# Patient Record
Sex: Male | Born: 1980 | Race: White | Hispanic: No | Smoking: Current every day smoker
Health system: Southern US, Community
[De-identification: ages and names within clinical notes are randomized; demographics above are authoritative.]

## PROBLEM LIST (undated history)

## (undated) DIAGNOSIS — S43006A Unspecified dislocation of unspecified shoulder joint, initial encounter: Secondary | ICD-10-CM

## (undated) DIAGNOSIS — R011 Cardiac murmur, unspecified: Secondary | ICD-10-CM

## (undated) HISTORY — PX: KNEE SURGERY: SHX244

---

## 2002-02-20 ENCOUNTER — Emergency Department (HOSPITAL_COMMUNITY): Admission: EM | Admit: 2002-02-20 | Discharge: 2002-02-20 | Payer: Self-pay | Admitting: Internal Medicine

## 2005-12-18 ENCOUNTER — Emergency Department (HOSPITAL_COMMUNITY): Admission: EM | Admit: 2005-12-18 | Discharge: 2005-12-18 | Payer: Self-pay | Admitting: Emergency Medicine

## 2009-07-15 ENCOUNTER — Emergency Department (HOSPITAL_COMMUNITY): Admission: EM | Admit: 2009-07-15 | Discharge: 2009-07-15 | Payer: Self-pay | Admitting: Emergency Medicine

## 2010-08-07 ENCOUNTER — Encounter: Payer: Self-pay | Admitting: *Deleted

## 2010-08-07 ENCOUNTER — Emergency Department (HOSPITAL_COMMUNITY)
Admission: EM | Admit: 2010-08-07 | Discharge: 2010-08-07 | Disposition: A | Discharge status: Home / Self Care | Payer: Self-pay | Attending: Emergency Medicine | Admitting: Emergency Medicine

## 2010-08-07 DIAGNOSIS — F17200 Nicotine dependence, unspecified, uncomplicated: Secondary | ICD-10-CM | POA: Insufficient documentation

## 2010-08-07 DIAGNOSIS — Z0000 Encounter for general adult medical examination without abnormal findings: Secondary | ICD-10-CM | POA: Insufficient documentation

## 2010-08-07 DIAGNOSIS — Z0189 Encounter for other specified special examinations: Secondary | ICD-10-CM | POA: Insufficient documentation

## 2010-08-07 LAB — CBC
HCT: 43.1 % (ref 39.0–52.0)
Hemoglobin: 14.4 g/dL (ref 13.0–17.0)
MCV: 86.2 fL (ref 78.0–100.0)
RDW: 12.6 % (ref 11.5–15.5)
WBC: 5.7 10*3/uL (ref 4.0–10.5)

## 2010-08-07 LAB — RAPID URINE DRUG SCREEN, HOSP PERFORMED
Amphetamines: NOT DETECTED
Barbiturates: NOT DETECTED
Benzodiazepines: POSITIVE — AB
Cocaine: NOT DETECTED
Opiates: NOT DETECTED
Tetrahydrocannabinol: POSITIVE — AB

## 2010-08-07 LAB — BASIC METABOLIC PANEL
BUN: 12 mg/dL (ref 6–23)
Chloride: 103 mEq/L (ref 96–112)
Creatinine, Ser: 0.82 mg/dL (ref 0.50–1.35)
GFR calc Af Amer: >60 mL/min (ref >60)
Glucose, Bld: 107 mg/dL — ABNORMAL HIGH (ref 70–99)
Potassium: 4.1 mEq/L (ref 3.5–5.1)

## 2010-08-07 LAB — URINALYSIS, ROUTINE W REFLEX MICROSCOPIC
Bilirubin Urine: NEGATIVE
Glucose, UA: NEGATIVE mg/dL
Specific Gravity, Urine: >1.03 — ABNORMAL HIGH (ref 1.005–1.030)
Urobilinogen, UA: 0.2 mg/dL (ref 0.0–1.0)
pH: 5.5 (ref 5.0–8.0)

## 2010-08-07 NOTE — ED Notes (Signed)
Pt eating. Nad.

## 2010-08-07 NOTE — ED Notes (Signed)
Ella from [facility] has been called and will be here asap. Nad. Urine obtained.

## 2010-08-07 NOTE — ED Notes (Signed)
edp in with pt now 
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
## 2010-08-07 NOTE — ED Notes (Signed)
Pt brought in by police for IVC. Pt denies suicidal or homicidal ideations.

## 2010-08-07 NOTE — ED Notes (Signed)
Ella from [facility] in to see pt at this time. Nad. Pt cooperative.

## 2010-09-17 ENCOUNTER — Emergency Department (HOSPITAL_COMMUNITY)
Admission: EM | Admit: 2010-09-17 | Discharge: 2010-09-17 | Discharge status: Against Medical Advice | Payer: Self-pay | Attending: Emergency Medicine | Admitting: Emergency Medicine

## 2010-09-17 ENCOUNTER — Encounter (HOSPITAL_COMMUNITY): Payer: Self-pay | Admitting: *Deleted

## 2010-09-17 DIAGNOSIS — J029 Acute pharyngitis, unspecified: Secondary | ICD-10-CM | POA: Insufficient documentation

## 2010-09-17 DIAGNOSIS — F17200 Nicotine dependence, unspecified, uncomplicated: Secondary | ICD-10-CM | POA: Insufficient documentation

## 2010-09-17 DIAGNOSIS — B349 Viral infection, unspecified: Secondary | ICD-10-CM

## 2010-09-17 HISTORY — DX: Unspecified dislocation of unspecified shoulder joint, initial encounter: S43.006A

## 2010-09-17 MED ORDER — ACETAMINOPHEN 160 MG/5ML PO SOLN
650.0000 mg | Freq: Once | ORAL | Status: AC
  Administered 2010-09-17: 650 mg via ORAL
  Filled 2010-09-17: qty 20.3

## 2010-09-17 NOTE — ED Notes (Signed)
Sore throat, fever, n/v unable to eat due to pain with swallowing, fever at triage >102, pt did not take tylenol or ibu today at home,

## 2010-09-17 NOTE — ED Provider Notes (Signed)
History     CSN: [id number] Arrival date & time: 09/17/2010 11:51 AM   Chief Complaint  Patient presents with  . Sore Throat  . Fever     (Include location/radiation/quality/duration/timing/severity/associated sxs/prior treatment) HPI  Complains of sore throat, pain with swallowing chills and myalgias for 3 days treated with ibuprofen yesterday with partial relief of symptoms . No vomiting no abdominal pain no shortness of breath admits to mild cough pain worse with swallowing improved with ibuprofen Past Medical History  Diagnosis Date  . Dislocated shoulder     several years ago      Past Surgical History  Procedure Date  . Knee surgery     left     Family History  Problem Relation Age of Onset  . Diabetes Mother   . Diabetes Sister     History  Substance Use Topics  . Smoking status: Current Everyday Smoker -- 0.5 packs/day    Types: Cigarettes  . Smokeless tobacco: Not on file  . Alcohol Use: Yes     occasional beer   Denies drug use   Review of Systems  Constitutional: Positive for chills.  HENT: Negative.        Sore throat, pain with slow  Eyes: Negative.   Respiratory: Positive for cough. Negative for shortness of breath.   Cardiovascular: Negative.   Gastrointestinal: Negative.   Musculoskeletal: Positive for myalgias.  Skin: Negative.   Neurological: Negative.   Hematological: Negative.   Psychiatric/Behavioral: Negative.     Allergies  Review of patient's allergies indicates no known allergies.  Home Medications   Current Outpatient Rx  Name Route Sig Dispense Refill  . IBUPROFEN 200 MG PO TABS Oral Take 200-400 mg by mouth every 6 (six) hours as needed. Pain     . ONE-DAILY MULTI VITAMINS PO TABS Oral Take 1 tablet by mouth daily.      Marland Kitchen ALPRAZOLAM 1 MG PO TABS Oral Take 1 mg by mouth.     Marland Kitchen HYDROCODONE-ACETAMINOPHEN 7.5-500 MG PO TABS Oral Take 1 tablet by mouth every 6 (six) hours as needed. pain       Physical Exam    BP  122/62  Pulse 117  Temp(Src) 102.7 F (39.3 C) (Oral)  Resp 20  Ht 6' (1.829 m)  Wt 200 lb (90.719 kg)  BMI 27.12 kg/m2  SpO2 99%  Physical Exam  Nursing note and vitals reviewed. Constitutional: He appears well-developed and well-nourished.  HENT:  Head: Normocephalic and atraumatic.       Oral pharynx reddened, uvula midline, slight white exudate on tonsils  Eyes: Conjunctivae are normal. Pupils are equal, round, and reactive to light.  Neck: Normal range of motion. Neck supple. No tracheal deviation present. No thyromegaly present.  Cardiovascular: Normal rate and regular rhythm.   No murmur heard. Pulmonary/Chest: Effort normal and breath sounds normal.       Occasional cough  Abdominal: Soft. Bowel sounds are normal. He exhibits no distension. There is no tenderness.       No splenomegaly  Musculoskeletal: Normal range of motion. He exhibits no edema and no tenderness.  Lymphadenopathy:    He has cervical adenopathy.  Neurological: He is alert. Coordination normal.  Skin: Skin is warm and dry. No rash noted.  Psychiatric: He has a normal mood and affect.    ED Course  Procedures  Results for orders placed during the hospital encounter of 09/17/10  RAPID STREP SCREEN      Component Value Range  Streptococcus, Group A Screen (Direct) NEGATIVE  NEGATIVE    No results found.   No diagnosis found.   MDM Symptoms and exam in strep test consistent with viral illness. Patient encouraged to stop smoking Tylenol or ibuprofen for pain and fever referral local health department       Doug Sou, MD 09/17/10 1329

## 2010-09-17 NOTE — ED Notes (Signed)
Called lab to inquire ref. Strep screen results, advised that the results should be back in a few minutes

## 2010-09-17 NOTE — ED Notes (Signed)
Went into room to discharge pt, no one was in room, discharges instructions placed in shredder

## 2010-09-17 NOTE — ED Notes (Signed)
C/o sore throat, fever, n/v states that he is not able to eat due to pain in throat area, fever,

## 2011-11-18 ENCOUNTER — Encounter (HOSPITAL_COMMUNITY): Payer: Self-pay | Admitting: *Deleted

## 2011-11-18 ENCOUNTER — Emergency Department (HOSPITAL_COMMUNITY): Payer: Self-pay

## 2011-11-18 ENCOUNTER — Emergency Department (HOSPITAL_COMMUNITY)
Admission: EM | Admit: 2011-11-18 | Discharge: 2011-11-18 | Disposition: A | Discharge status: Home / Self Care | Payer: Self-pay | Attending: Emergency Medicine | Admitting: Emergency Medicine

## 2011-11-18 DIAGNOSIS — Z79899 Other long term (current) drug therapy: Secondary | ICD-10-CM | POA: Insufficient documentation

## 2011-11-18 DIAGNOSIS — S60229A Contusion of unspecified hand, initial encounter: Secondary | ICD-10-CM | POA: Insufficient documentation

## 2011-11-18 DIAGNOSIS — F3289 Other specified depressive episodes: Secondary | ICD-10-CM | POA: Insufficient documentation

## 2011-11-18 DIAGNOSIS — Y939 Activity, unspecified: Secondary | ICD-10-CM | POA: Insufficient documentation

## 2011-11-18 DIAGNOSIS — Z87828 Personal history of other (healed) physical injury and trauma: Secondary | ICD-10-CM | POA: Insufficient documentation

## 2011-11-18 DIAGNOSIS — F329 Major depressive disorder, single episode, unspecified: Secondary | ICD-10-CM | POA: Insufficient documentation

## 2011-11-18 DIAGNOSIS — F17200 Nicotine dependence, unspecified, uncomplicated: Secondary | ICD-10-CM | POA: Insufficient documentation

## 2011-11-18 HISTORY — DX: Cardiac murmur, unspecified: R01.1
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
## 2011-11-19 MED ORDER — TRAZODONE HCL 50 MG PO TABS
ORAL_TABLET | ORAL | Status: AC
  Filled 2011-11-19: qty 2

## 2012-01-12 ENCOUNTER — Encounter (HOSPITAL_COMMUNITY): Payer: Self-pay | Admitting: *Deleted

## 2012-01-12 ENCOUNTER — Emergency Department (HOSPITAL_COMMUNITY)
Admission: EM | Admit: 2012-01-12 | Discharge: 2012-01-12 | Discharge status: Against Medical Advice | Payer: Self-pay | Attending: Emergency Medicine | Admitting: Emergency Medicine

## 2012-01-12 ENCOUNTER — Encounter (HOSPITAL_COMMUNITY): Payer: Self-pay

## 2012-01-12 ENCOUNTER — Emergency Department (HOSPITAL_COMMUNITY)
Admission: EM | Admit: 2012-01-12 | Discharge: 2012-01-12 | Disposition: A | Discharge status: Home / Self Care | Payer: Self-pay | Attending: Emergency Medicine | Admitting: Emergency Medicine

## 2012-01-12 DIAGNOSIS — F17200 Nicotine dependence, unspecified, uncomplicated: Secondary | ICD-10-CM | POA: Insufficient documentation

## 2012-01-12 DIAGNOSIS — W292XXA Contact with other powered household machinery, initial encounter: Secondary | ICD-10-CM | POA: Insufficient documentation

## 2012-01-12 DIAGNOSIS — S61209A Unspecified open wound of unspecified finger without damage to nail, initial encounter: Secondary | ICD-10-CM | POA: Insufficient documentation

## 2012-01-12 DIAGNOSIS — Y929 Unspecified place or not applicable: Secondary | ICD-10-CM | POA: Insufficient documentation

## 2012-01-12 DIAGNOSIS — S6990XA Unspecified injury of unspecified wrist, hand and finger(s), initial encounter: Secondary | ICD-10-CM | POA: Insufficient documentation

## 2012-01-12 DIAGNOSIS — Y9389 Activity, other specified: Secondary | ICD-10-CM | POA: Insufficient documentation

## 2012-01-12 DIAGNOSIS — Z79899 Other long term (current) drug therapy: Secondary | ICD-10-CM | POA: Insufficient documentation

## 2012-01-12 DIAGNOSIS — R011 Cardiac murmur, unspecified: Secondary | ICD-10-CM | POA: Insufficient documentation

## 2012-01-12 DIAGNOSIS — Z87828 Personal history of other (healed) physical injury and trauma: Secondary | ICD-10-CM | POA: Insufficient documentation

## 2012-01-12 DIAGNOSIS — IMO0002 Reserved for concepts with insufficient information to code with codable children: Secondary | ICD-10-CM

## 2012-01-12 DIAGNOSIS — W278XXA Contact with other nonpowered hand tool, initial encounter: Secondary | ICD-10-CM | POA: Insufficient documentation

## 2012-01-12 DIAGNOSIS — S6980XA Other specified injuries of unspecified wrist, hand and finger(s), initial encounter: Secondary | ICD-10-CM | POA: Insufficient documentation

## 2012-01-12 MED ORDER — LIDOCAINE HCL (PF) 1 % IJ SOLN
INTRAMUSCULAR | Status: AC
  Administered 2012-01-12: 04:00:00
  Filled 2012-01-12: qty 5

## 2012-01-12 MED ORDER — BACITRACIN-NEOMYCIN-POLYMYXIN 400-5-5000 EX OINT
TOPICAL_OINTMENT | Freq: Once | CUTANEOUS | Status: AC
  Administered 2012-01-12: 1 via TOPICAL
  Filled 2012-01-12: qty 1

## 2012-01-12 NOTE — ED Notes (Signed)
Pt cut finger w/ kitchen knife.
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
## 2012-01-12 NOTE — ED Notes (Signed)
Patient currently talking on telephone
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
## 2012-01-12 NOTE — ED Notes (Signed)
3cm lac to the left index finger. Bleeding controlled. Finger cleaned w/ shur clens.

## 2012-01-30 ENCOUNTER — Emergency Department (HOSPITAL_COMMUNITY)
Admission: EM | Admit: 2012-01-30 | Discharge: 2012-01-30 | Payer: Self-pay | Attending: Emergency Medicine | Admitting: Emergency Medicine

## 2012-01-30 ENCOUNTER — Encounter (HOSPITAL_COMMUNITY): Payer: Self-pay | Admitting: *Deleted

## 2012-01-30 DIAGNOSIS — Y929 Unspecified place or not applicable: Secondary | ICD-10-CM | POA: Insufficient documentation

## 2012-01-30 DIAGNOSIS — Z79899 Other long term (current) drug therapy: Secondary | ICD-10-CM | POA: Insufficient documentation

## 2012-01-30 DIAGNOSIS — F411 Generalized anxiety disorder: Secondary | ICD-10-CM | POA: Insufficient documentation

## 2012-01-30 DIAGNOSIS — R569 Unspecified convulsions: Secondary | ICD-10-CM | POA: Insufficient documentation

## 2012-01-30 DIAGNOSIS — F17200 Nicotine dependence, unspecified, uncomplicated: Secondary | ICD-10-CM | POA: Insufficient documentation

## 2012-01-30 DIAGNOSIS — Y9389 Activity, other specified: Secondary | ICD-10-CM | POA: Insufficient documentation

## 2012-01-30 DIAGNOSIS — R002 Palpitations: Secondary | ICD-10-CM | POA: Insufficient documentation

## 2012-01-30 DIAGNOSIS — IMO0002 Reserved for concepts with insufficient information to code with codable children: Secondary | ICD-10-CM | POA: Insufficient documentation

## 2012-01-30 DIAGNOSIS — R0602 Shortness of breath: Secondary | ICD-10-CM | POA: Insufficient documentation

## 2012-01-30 DIAGNOSIS — E119 Type 2 diabetes mellitus without complications: Secondary | ICD-10-CM | POA: Insufficient documentation

## 2012-01-30 DIAGNOSIS — Z8679 Personal history of other diseases of the circulatory system: Secondary | ICD-10-CM | POA: Insufficient documentation

## 2012-01-30 DIAGNOSIS — Z8739 Personal history of other diseases of the musculoskeletal system and connective tissue: Secondary | ICD-10-CM | POA: Insufficient documentation

## 2012-01-30 DIAGNOSIS — R000 Tachycardia, unspecified: Secondary | ICD-10-CM | POA: Insufficient documentation

## 2012-01-30 DIAGNOSIS — T43691A Poisoning by other psychostimulants, accidental (unintentional), initial encounter: Secondary | ICD-10-CM | POA: Insufficient documentation

## 2012-01-30 DIAGNOSIS — F909 Attention-deficit hyperactivity disorder, unspecified type: Secondary | ICD-10-CM | POA: Insufficient documentation

## 2012-01-30 DIAGNOSIS — F319 Bipolar disorder, unspecified: Secondary | ICD-10-CM | POA: Insufficient documentation

## 2012-01-30 DIAGNOSIS — T50901A Poisoning by unspecified drugs, medicaments and biological substances, accidental (unintentional), initial encounter: Secondary | ICD-10-CM

## 2012-01-30 DIAGNOSIS — Z23 Encounter for immunization: Secondary | ICD-10-CM | POA: Insufficient documentation

## 2012-01-30 LAB — CBC WITH DIFFERENTIAL/PLATELET
Eosinophils Absolute: 0 10*3/uL (ref 0.0–0.7)
Eosinophils Relative: 0 % (ref 0–5)
Hemoglobin: 14.5 g/dL (ref 13.0–17.0)
Lymphocytes Relative: 17 % (ref 12–46)
Lymphs Abs: 1.1 10*3/uL (ref 0.7–4.0)
MCH: 28.5 pg (ref 26.0–34.0)
MCV: 82.7 fL (ref 78.0–100.0)
Monocytes Relative: 7 % (ref 3–12)
Platelets: 228 10*3/uL (ref 150–400)
RBC: 5.09 MIL/uL (ref 4.22–5.81)
WBC: 6.4 10*3/uL (ref 4.0–10.5)

## 2012-01-30 LAB — COMPREHENSIVE METABOLIC PANEL
ALT: 35 U/L (ref 0–53)
Alkaline Phosphatase: 57 U/L (ref 39–117)
BUN: 9 mg/dL (ref 6–23)
CO2: 25 mEq/L (ref 19–32)
Calcium: 9.3 mg/dL (ref 8.4–10.5)
GFR calc Af Amer: >90 mL/min (ref >90)
GFR calc non Af Amer: >90 mL/min (ref >90)
Glucose, Bld: 117 mg/dL — ABNORMAL HIGH (ref 70–99)
Potassium: 4 mEq/L (ref 3.5–5.1)
Sodium: 137 mEq/L (ref 135–145)
Total Protein: 8.1 g/dL (ref 6.0–8.3)

## 2012-01-30 LAB — ACETAMINOPHEN LEVEL
Acetaminophen (Tylenol), Serum: <15 ug/mL (ref 10–30)
Acetaminophen (Tylenol), Serum: <15 ug/mL (ref 10–30)

## 2012-01-30 LAB — RAPID URINE DRUG SCREEN, HOSP PERFORMED: Barbiturates: NOT DETECTED

## 2012-01-30 MED ORDER — LORAZEPAM 2 MG/ML IJ SOLN
1.0000 mg | Freq: Once | INTRAMUSCULAR | Status: AC
  Administered 2012-01-30: 1 mg via INTRAVENOUS
  Filled 2012-01-30: qty 1

## 2012-01-30 MED ORDER — DIPHENHYDRAMINE HCL 25 MG PO CAPS
50.0000 mg | ORAL_CAPSULE | Freq: Once | ORAL | Status: AC
  Administered 2012-01-30: 50 mg via ORAL
  Filled 2012-01-30: qty 2

## 2012-01-30 MED ORDER — TETANUS-DIPHTH-ACELL PERTUSSIS 5-2.5-18.5 LF-MCG/0.5 IM SUSP
0.5000 mL | Freq: Once | INTRAMUSCULAR | Status: AC
  Administered 2012-01-30: 0.5 mL via INTRAMUSCULAR
  Filled 2012-01-30: qty 0.5

## 2012-01-30 MED ORDER — NICOTINE 21 MG/24HR TD PT24
21.0000 mg | MEDICATED_PATCH | Freq: Once | TRANSDERMAL | Status: AC
  Administered 2012-01-30: 21 mg via TRANSDERMAL

## 2012-01-30 MED ORDER — SODIUM CHLORIDE 0.9 % IV BOLUS (SEPSIS)
1000.0000 mL | Freq: Once | INTRAVENOUS | Status: AC
  Administered 2012-01-30: 1000 mL via INTRAVENOUS

## 2012-01-30 MED ORDER — ACETAMINOPHEN 325 MG PO TABS
650.0000 mg | ORAL_TABLET | Freq: Once | ORAL | Status: AC
  Administered 2012-01-30: 650 mg via ORAL
  Filled 2012-01-30: qty 2

## 2012-01-30 MED ORDER — NICOTINE 21 MG/24HR TD PT24
MEDICATED_PATCH | TRANSDERMAL | Status: AC
  Administered 2012-01-30: 21 mg via TRANSDERMAL
  Filled 2012-01-30: qty 1

## 2012-01-30 NOTE — ED Notes (Signed)
Spoke with CJ at [facility]. He reccommended activated charcoal. Repeat ekg in several hours and to monitor for several hours. States to watch for HTN, tachycardia, seizures.
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
## 2012-01-30 NOTE — ED Notes (Signed)
Pt states he took 16 vivance, something similar to adderall.

## 2012-01-30 NOTE — ED Notes (Addendum)
Pt was in police custody, when he stated he took a unknown amount of a unknown drug. Was stated that it may have been adderall
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
## 2012-01-30 NOTE — ED Notes (Addendum)
Scratch noted to right neck and left forearm

## 2012-01-30 NOTE — ED Notes (Signed)
Gave pt lunch tray 
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
## 2012-01-30 NOTE — ED Notes (Addendum)
[place] from [employer] called for up date on pt. Recommended benzodiazapine for anxiety and benzodiazapine or phenobarbital for any seizure activity.

## 2012-01-30 NOTE — ED Notes (Signed)
Patient did not get in and out cath. He gave a clean catch specimen

## 2012-01-30 NOTE — ED Notes (Signed)
Pt requesting something to help "calm him down".  Notified edp

## 2012-03-23 ENCOUNTER — Ambulatory Visit (HOSPITAL_COMMUNITY): Admission: AD | Admit: 2012-03-23 | Payer: Self-pay | Source: Home / Self Care | Admitting: Psychiatry

## 2013-12-30 ENCOUNTER — Emergency Department (HOSPITAL_COMMUNITY)
Admission: EM | Admit: 2013-12-30 | Discharge: 2013-12-30 | Discharge status: Against Medical Advice | Payer: Self-pay | Attending: Emergency Medicine | Admitting: Emergency Medicine

## 2013-12-30 ENCOUNTER — Encounter (HOSPITAL_COMMUNITY): Payer: Self-pay | Admitting: Family Medicine

## 2013-12-30 DIAGNOSIS — R011 Cardiac murmur, unspecified: Secondary | ICD-10-CM | POA: Insufficient documentation

## 2013-12-30 DIAGNOSIS — Z720 Tobacco use: Secondary | ICD-10-CM | POA: Insufficient documentation

## 2013-12-30 DIAGNOSIS — F1110 Opioid abuse, uncomplicated: Secondary | ICD-10-CM | POA: Insufficient documentation

## 2013-12-30 LAB — COMPREHENSIVE METABOLIC PANEL
ALBUMIN: 4.1 g/dL (ref 3.5–5.2)
ALT: 51 U/L (ref 0–53)
ANION GAP: 8 (ref 5–15)
AST: 35 U/L (ref 0–37)
Alkaline Phosphatase: 102 U/L (ref 39–117)
BILIRUBIN TOTAL: 0.6 mg/dL (ref 0.3–1.2)
BUN: 6 mg/dL (ref 6–23)
CALCIUM: 9.3 mg/dL (ref 8.4–10.5)
CHLORIDE: 108 meq/L (ref 96–112)
CO2: 23 mmol/L (ref 19–32)
CREATININE: 0.77 mg/dL (ref 0.50–1.35)
GFR calc Af Amer: >90 mL/min (ref >90)
GFR calc non Af Amer: >90 mL/min (ref >90)
Glucose, Bld: 111 mg/dL — ABNORMAL HIGH (ref 70–99)
Potassium: 3.9 mmol/L (ref 3.5–5.1)
Sodium: 139 mmol/L (ref 135–145)
Total Protein: 8 g/dL (ref 6.0–8.3)

## 2013-12-30 LAB — ETHANOL

## 2013-12-30 LAB — SALICYLATE LEVEL

## 2013-12-30 LAB — CBC
HEMATOCRIT: 40.9 % (ref 39.0–52.0)
Hemoglobin: 13.8 g/dL (ref 13.0–17.0)
MCH: 26.9 pg (ref 26.0–34.0)
MCHC: 33.7 g/dL (ref 30.0–36.0)
MCV: 79.7 fL (ref 78.0–100.0)
PLATELETS: 212 10*3/uL (ref 150–400)
RBC: 5.13 MIL/uL (ref 4.22–5.81)
RDW: 12.5 % (ref 11.5–15.5)
WBC: 7.9 10*3/uL (ref 4.0–10.5)

## 2013-12-30 NOTE — ED Notes (Addendum)
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 sts that before that he was shooting up percocet and other narcotics before that. sts last use yesterday. sts withdrawal and cannot eat. Pt denies SI or HI. Pt sts also detox from alcohol.

## 2013-12-30 NOTE — ED Notes (Signed)
Called at triage x2 with no answer 

## 2013-12-30 NOTE — ED Notes (Signed)
Patient called at triage for room x2. No answer 

## 2013-12-30 NOTE — ED Notes (Signed)
Called at triage for room x2 with no answer 

## 2014-01-02 ENCOUNTER — Emergency Department (HOSPITAL_COMMUNITY)
Admission: EM | Admit: 2014-01-02 | Discharge: 2014-01-02 | Disposition: A | Discharge status: Home / Self Care | Payer: Self-pay | Attending: Emergency Medicine | Admitting: Emergency Medicine

## 2014-01-02 ENCOUNTER — Encounter (HOSPITAL_COMMUNITY): Payer: Self-pay | Admitting: Family Medicine

## 2014-01-02 DIAGNOSIS — R011 Cardiac murmur, unspecified: Secondary | ICD-10-CM | POA: Insufficient documentation

## 2014-01-02 DIAGNOSIS — Z720 Tobacco use: Secondary | ICD-10-CM | POA: Insufficient documentation

## 2014-01-02 DIAGNOSIS — F1110 Opioid abuse, uncomplicated: Secondary | ICD-10-CM | POA: Insufficient documentation

## 2014-01-02 DIAGNOSIS — F1910 Other psychoactive substance abuse, uncomplicated: Secondary | ICD-10-CM

## 2014-01-02 NOTE — ED Provider Notes (Signed)
CSN: [id number]     Arrival date & time 01/02/14  1130 History   First MD Initiated Contact with Patient 01/02/14 1220     Chief Complaint  Patient presents with  . Drug Problem   HPI  Pt has history of substance abuse problems.  He has history of opiate use.  The patient was trying to get himself off of drugs but his sister who uses heroin was able to convince him to inject Opana about 5 days ago.  The patient denies using any drugs since then. He still admits to having some withdrawal symptoms in the morning but overall is feeling better. Eyes any suicidal or homicidal ideation. Not interested in getting into a detox program. He thinks he can manage this on his own. The reason why he came to the emergency room is that his sister was admitted to the hospital for a staph infection. Patient was concerned that he may have developed one as well. Denies any fevers or chills. He has noticed some scarring where he injected himself but no redness or swelling. Past Medical History  Diagnosis Date  . Dislocated shoulder     several years ago   . Heart murmur    Past Surgical History  Procedure Laterality Date  . Knee surgery      left    Family History  Problem Relation Age of Onset  . Diabetes Mother   . Diabetes Sister    History  Substance Use Topics  . Smoking status: Current Every Day Smoker -- 0.50 packs/day    Types: Cigarettes  . Smokeless tobacco: Not on file  . Alcohol Use: Yes     Comment: occasional beer    Review of Systems  Respiratory: Negative for shortness of breath.   Cardiovascular: Negative for chest pain.  Skin: Negative for rash.  All other systems reviewed and are negative.     Allergies  Review of patient's allergies indicates no known allergies.  Home Medications   Prior to Admission medications   Medication Sig Start Date End Date Taking? Authorizing Provider  ibuprofen (ADVIL,MOTRIN) 200 MG tablet Take 200-400 mg by mouth every 6 (six) hours as  needed. Pain     Historical Provider, MD   BP 143/88 mmHg  Pulse 100  Temp(Src) 98.3 F (36.8 C)  Resp 19  SpO2 97% Physical Exam  Constitutional: He appears well-developed and well-nourished. No distress.  HENT:  Head: Normocephalic and atraumatic.  Right Ear: External ear normal.  Left Ear: External ear normal.  Eyes: Conjunctivae are normal. Right eye exhibits no discharge. Left eye exhibits no discharge. No scleral icterus.  Neck: Neck supple. No tracheal deviation present.  Cardiovascular: Normal rate, regular rhythm and intact distal pulses.   Pulmonary/Chest: Effort normal and breath sounds normal. No stridor. No respiratory distress. He has no wheezes. He has no rales.  Abdominal: Soft. Bowel sounds are normal. He exhibits no distension. There is no tenderness. There is no rebound and no guarding.  Musculoskeletal: He exhibits no edema or tenderness.  Neurological: He is alert. He has normal strength. No cranial nerve deficit (no facial droop, extraocular movements intact, no slurred speech) or sensory deficit. He exhibits normal muscle tone. He displays no seizure activity. Coordination normal.  Skin: Skin is warm and dry. No rash noted.  Psychiatric: He has a normal mood and affect.  Nursing note and vitals reviewed.   ED Course  Procedures (including critical care time) Labs Review Labs Reviewed - No data  to display  Imaging Review No results found.   EKG Interpretation None      MDM   Final diagnoses:  Substance abuse    Patient has history of substance abuse. He is not interested in any treatment. He denies any suicidal or homicidal ideation. Patient does not have any evidence of infection associated with his recent IV drug abuse.  Patient is stable for discharge. Resource guide was provided.   Linwood DibblesJon Autie Vasudevan, MD 01/02/14 1249
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
## 2014-01-02 NOTE — Discharge Instructions (Signed)
Substance Abuse Treatment Programs ° °Intensive Outpatient Programs °[facility]     °[address]      °[place], [place]                   °[phone]      ° °[facility] °[address] °[place], [place] °[phone] ° °[facility]     °(Inpatient and outpatient)     °[address]           °[phone]   ° °[facility] °336-288-1484 (Suboxone and Methadone) ° °[address]      °[place], [place] [address]      °336-882-2125      ° °[address] °[place], [place] °852-3033 ° °[facility] (Outpatient/Inpatient, Chemical)    °(insurance only) [phone]      °       °[facility] (Groups & Residential) °[place], [place] °336-389-1413 ° °   °[facility]     °[address]     °[place], [place]      °[phone]      ° °[facility] (for caregivers and family) °[address] °[place], [place] °336-299-4655 ° ° ° ° ° °Residential Treatment Programs °[facility]      °[address], [place], [place] [address]  °([phone]      ° °[facility] °[address] [place], [place] [address] °[phone] ° °[facility]        °336-248-8914      ° °[facility] °1-800-659-3381 ° °[facility]             °[address]                                         °[place], [place]                                                °877-615-2722 or 336-784-9470                              ° °[facility] [place] °[address] °[place], [address] °1.877.941.8954 ° °[facility]    °[address]      °[place], [place]     °[phone]      ° °[facility] °[address] °[place], [place] °336-285-9073 ° °[facility]   °[address]     °[place], [place] [address]     °[phone]      °Admissions: 8am-3pm M-F ° °[facility] °[address] °[place],  [place] °336-227-7417 ° °[facility]: Residential Program (90 Days)   °[place], [place]      °336-725-8389 or 800-758-6077    ° °[facility] °[place], [place] °(Walk in Hours over the weekend or by referral) ° °[facility] °[address], [place],  [address] °(336) [phone] ° °Crisis Mobile: [facility]:  1-877-626-1772 (for crisis response 24 hours a day) °[facility] Hotline:      1-800-256-2452 °Outpatient Psychiatry and Counseling ° °[facility]: Mobile Crisis   Management 24 hours:  1-877-626-1772 ° °[facility] sliding scale fee and walk in schedule: M-F 8am-12pm/1pm-3pm °[address]  °[place], [place] [address] °[phone] ° °[facility] °[address] °[place], [place] [address] °336-703-9650 ° °[facility] ([facility] known [facility])- new patient walk-in appointments available Monday - Friday 8am -3pm.          °[address] °[place], [place] [address] °336-676-6840 or crisis line- 336-676-6905 ° °[facility]/ Intensive Outpatient Therapy Program °[address] °[place], [place] [address] °336-832-9804 ° °[facility]                  °Crisis Services      °[phone]      °[address]     °[place], [place] [address]                ° °[facility]   °[facility] °[phone] °[address] °[place], [place] [address] ° ° °[facility]          °[address],  °[place], [place] [address]       °([phone] ° °[facility] °[address] °[place], [place] [address] °336-292-1510 ° °[facility]    °[address]    °[place], [place] [address]     °336-632-3505      ° °Parish McKinney, MD     °[address]     °[place] [address]     °336-282-1251     °  °[facility] °[address] °[place] [address] ° °[facility]     °[address]     °[place], [place]      °336-542-2076      ° °[facility] °Shamsher Ahluwalia, MD °[address] °[place], [place] [address] °336-420-9558 ° °[facility]     °[address]     °[place], [place] [address]     °336-274-1237      ° °[facility] °[address] °[place], [place] [address] °336-854-4450 °Resources for Temporary Residential Assistance/Crisis Centers ° °DAY CENTERS °[facility] °M-F 8am-3pm   °[address] [place], [place] [address]   336-332-0824 °Services include: laundry, barbering, support groups, case management, phone  & computer access, showers, AA/NA mtgs, mental health/substance abuse nurse, job skills class, disability information, VA assistance, spiritual classes, etc.  ° °HOMELESS SHELTERS ° °[facility]     °[facility]   °[address], [place]     °336.271.5959       °       °[facility] (women and children)       °[address] °[place], [place] [address] °336-275-0820 °[email or web address] for application and process °Application Required ° °[facility]   °[address]    °[place]  [address]     °[phone]       °             °[facility] °[address] °,  [address] °336.273.5572 °336-235-0363(schedule application appt.) °Application Required ° °[facility] (women only)    °[address]     °[place],  [address]     °336-884-1039      °  Intake starts 6pm daily °Need valid ID, SSC, & Police report °[facility] [place] °[address] °[place], [place] °336-881-5420 °Application Required ° °[facility] (men only)     °[address]      °[place], [place]     °[phone]      ° °[facility] °(Pregnant women only) °[address] °[place], [place] °336-275-0206 ° °[facility]      °[address]      °[place], [place] [address]     °336-722-9951      °       °[facility] °[address] °[place], [place] °336-723-1848 °90 day commitment/SA/Application process ° °[facility](men only)     °[address]     °[place], [place]     °[phone]       °Check-in at 7pm     °       °[facility] °[address] °[place], [place] [address] °336-248-6684 °Men/Women/Women and Children must be there by 7 pm ° °[facility] °[place], [place] °336-722-8721                ° °

## 2015-09-02 ENCOUNTER — Encounter (HOSPITAL_COMMUNITY): Payer: Self-pay | Admitting: Emergency Medicine

## 2015-09-02 ENCOUNTER — Emergency Department (HOSPITAL_COMMUNITY)
Admission: EM | Admit: 2015-09-02 | Discharge: 2015-09-02 | Disposition: A | Discharge status: Home / Self Care | Payer: Self-pay | Attending: Emergency Medicine | Admitting: Emergency Medicine

## 2015-09-02 ENCOUNTER — Emergency Department (HOSPITAL_COMMUNITY): Payer: Self-pay

## 2015-09-02 DIAGNOSIS — F17210 Nicotine dependence, cigarettes, uncomplicated: Secondary | ICD-10-CM | POA: Insufficient documentation

## 2015-09-02 DIAGNOSIS — Y929 Unspecified place or not applicable: Secondary | ICD-10-CM | POA: Insufficient documentation

## 2015-09-02 DIAGNOSIS — Y999 Unspecified external cause status: Secondary | ICD-10-CM | POA: Insufficient documentation

## 2015-09-02 DIAGNOSIS — Y939 Activity, unspecified: Secondary | ICD-10-CM | POA: Insufficient documentation

## 2015-09-02 DIAGNOSIS — S50812A Abrasion of left forearm, initial encounter: Secondary | ICD-10-CM | POA: Insufficient documentation

## 2015-09-02 DIAGNOSIS — S0990XA Unspecified injury of head, initial encounter: Secondary | ICD-10-CM

## 2015-09-02 DIAGNOSIS — S0101XA Laceration without foreign body of scalp, initial encounter: Secondary | ICD-10-CM | POA: Insufficient documentation

## 2015-09-02 DIAGNOSIS — Z23 Encounter for immunization: Secondary | ICD-10-CM | POA: Insufficient documentation

## 2015-09-02 LAB — BASIC METABOLIC PANEL
ANION GAP: 14 (ref 5–15)
BUN: 12 mg/dL (ref 6–20)
CO2: 20 mmol/L — ABNORMAL LOW (ref 22–32)
Calcium: 8.8 mg/dL — ABNORMAL LOW (ref 8.9–10.3)
Chloride: 102 mmol/L (ref 101–111)
Creatinine, Ser: 0.9 mg/dL (ref 0.61–1.24)
Glucose, Bld: 112 mg/dL — ABNORMAL HIGH (ref 65–99)
POTASSIUM: 3.3 mmol/L — AB (ref 3.5–5.1)
SODIUM: 136 mmol/L (ref 135–145)

## 2015-09-02 LAB — I-STAT CHEM 8, ED
BUN: 14 mg/dL (ref 6–20)
CALCIUM ION: 1.03 mmol/L — AB (ref 1.15–1.40)
CHLORIDE: 102 mmol/L (ref 101–111)
Creatinine, Ser: 1.1 mg/dL (ref 0.61–1.24)
Glucose, Bld: 113 mg/dL — ABNORMAL HIGH (ref 65–99)
HEMATOCRIT: 41 % (ref 39.0–52.0)
Hemoglobin: 13.9 g/dL (ref 13.0–17.0)
POTASSIUM: 3.4 mmol/L — AB (ref 3.5–5.1)
Sodium: 139 mmol/L (ref 135–145)
TCO2: 22 mmol/L (ref 0–100)

## 2015-09-02 LAB — PREPARE FRESH FROZEN PLASMA
UNIT DIVISION: 0
Unit division: 0

## 2015-09-02 LAB — CBC WITH DIFFERENTIAL/PLATELET
BASOS ABS: 0.1 10*3/uL (ref 0.0–0.1)
BASOS PCT: 2 %
EOS ABS: 0.2 10*3/uL (ref 0.0–0.7)
EOS PCT: 2 %
HCT: 41.4 % (ref 39.0–52.0)
HEMOGLOBIN: 13.3 g/dL (ref 13.0–17.0)
Lymphocytes Relative: 32 %
Lymphs Abs: 2.7 10*3/uL (ref 0.7–4.0)
MCH: 26.7 pg (ref 26.0–34.0)
MCHC: 32.1 g/dL (ref 30.0–36.0)
MCV: 83.1 fL (ref 78.0–100.0)
Monocytes Absolute: 0.8 10*3/uL (ref 0.1–1.0)
Monocytes Relative: 10 %
NEUTROS PCT: 54 %
Neutro Abs: 4.4 10*3/uL (ref 1.7–7.7)
PLATELETS: 189 10*3/uL (ref 150–400)
RBC: 4.98 MIL/uL (ref 4.22–5.81)
RDW: 12.6 % (ref 11.5–15.5)
WBC: 8.3 10*3/uL (ref 4.0–10.5)

## 2015-09-02 LAB — TYPE AND SCREEN
ABO/RH(D): A POS
ANTIBODY SCREEN: NEGATIVE
UNIT DIVISION: 0
Unit division: 0

## 2015-09-02 LAB — I-STAT CG4 LACTIC ACID, ED: LACTIC ACID, VENOUS: 3.71 mmol/L — AB (ref 0.5–1.9)

## 2015-09-02 LAB — ABO/RH: ABO/RH(D): A POS

## 2015-09-02 MED ORDER — ONDANSETRON HCL 4 MG/2ML IJ SOLN
4.0000 mg | Freq: Once | INTRAMUSCULAR | Status: AC
  Administered 2015-09-02: 4 mg via INTRAVENOUS
  Filled 2015-09-02: qty 2

## 2015-09-02 MED ORDER — OXYCODONE-ACETAMINOPHEN 5-325 MG PO TABS
1.0000 | ORAL_TABLET | Freq: Once | ORAL | Status: AC
  Administered 2015-09-02: 1 via ORAL
  Filled 2015-09-02: qty 1

## 2015-09-02 MED ORDER — MORPHINE SULFATE (PF) 4 MG/ML IV SOLN
4.0000 mg | Freq: Once | INTRAVENOUS | Status: AC
  Administered 2015-09-02: 4 mg via INTRAVENOUS
  Filled 2015-09-02: qty 1

## 2015-09-02 MED ORDER — OXYCODONE-ACETAMINOPHEN 5-325 MG PO TABS: 1.0000 | ORAL_TABLET | ORAL | 0 refills | Status: DC | PRN

## 2015-09-02 MED ORDER — LIDOCAINE-EPINEPHRINE (PF) 2 %-1:200000 IJ SOLN
10.0000 mL | Freq: Once | INTRAMUSCULAR | Status: AC
  Administered 2015-09-02: 10 mL
  Filled 2015-09-02: qty 20

## 2015-09-02 MED ORDER — TETANUS-DIPHTH-ACELL PERTUSSIS 5-2.5-18.5 LF-MCG/0.5 IM SUSP
0.5000 mL | Freq: Once | INTRAMUSCULAR | Status: AC
Start: 2015-09-02 — End: 2015-09-02
  Administered 2015-09-02: 0.5 mL via INTRAMUSCULAR
  Filled 2015-09-02: qty 0.5
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
## 2015-09-02 NOTE — ED Notes (Signed)
Pt has 2 sutures behind left ear by Dr.Pollina
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
## 2015-09-02 NOTE — ED Notes (Signed)
Patient would not keep BP cuff and pulse ox on.  C collar removed per Dr Blinda LeatherwoodPollina

## 2015-09-02 NOTE — ED Notes (Signed)
Patient would not keep BP
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
## 2017-10-26 ENCOUNTER — Encounter (HOSPITAL_COMMUNITY)
Admission: EM | Disposition: A | Discharge status: Home / Self Care | Payer: Self-pay | Source: Home / Self Care | Attending: Internal Medicine

## 2017-10-26 ENCOUNTER — Emergency Department (HOSPITAL_COMMUNITY): Payer: Self-pay | Admitting: Anesthesiology

## 2017-10-26 ENCOUNTER — Inpatient Hospital Stay: Admit: 2017-10-26 | Payer: Self-pay | Admitting: Orthopedic Surgery

## 2017-10-26 ENCOUNTER — Ambulatory Visit (HOSPITAL_COMMUNITY)
Admission: EM | Admit: 2017-10-26 | Discharge: 2017-10-26 | Disposition: A | Discharge status: Home / Self Care | Payer: Self-pay | Attending: Family Medicine | Admitting: Family Medicine

## 2017-10-26 ENCOUNTER — Other Ambulatory Visit: Payer: Self-pay

## 2017-10-26 ENCOUNTER — Encounter (HOSPITAL_COMMUNITY): Payer: Self-pay | Admitting: Emergency Medicine

## 2017-10-26 ENCOUNTER — Inpatient Hospital Stay (HOSPITAL_COMMUNITY)
Admission: EM | Admit: 2017-10-26 | Discharge: 2017-10-27 | DRG: 603 | Disposition: A | Discharge status: Home / Self Care | Payer: Self-pay | Attending: Family Medicine | Admitting: Family Medicine

## 2017-10-26 ENCOUNTER — Ambulatory Visit (INDEPENDENT_AMBULATORY_CARE_PROVIDER_SITE_OTHER): Payer: Self-pay

## 2017-10-26 ENCOUNTER — Encounter (HOSPITAL_COMMUNITY): Payer: Self-pay

## 2017-10-26 DIAGNOSIS — L0291 Cutaneous abscess, unspecified: Secondary | ICD-10-CM | POA: Diagnosis present

## 2017-10-26 DIAGNOSIS — W503XXA Accidental bite by another person, initial encounter: Secondary | ICD-10-CM

## 2017-10-26 DIAGNOSIS — S61259A Open bite of unspecified finger without damage to nail, initial encounter: Secondary | ICD-10-CM

## 2017-10-26 DIAGNOSIS — B950 Streptococcus, group A, as the cause of diseases classified elsewhere: Secondary | ICD-10-CM | POA: Diagnosis present

## 2017-10-26 DIAGNOSIS — I891 Lymphangitis: Secondary | ICD-10-CM | POA: Diagnosis present

## 2017-10-26 DIAGNOSIS — L089 Local infection of the skin and subcutaneous tissue, unspecified: Principal | ICD-10-CM | POA: Diagnosis present

## 2017-10-26 DIAGNOSIS — S61214A Laceration without foreign body of right ring finger without damage to nail, initial encounter: Secondary | ICD-10-CM | POA: Diagnosis present

## 2017-10-26 DIAGNOSIS — F17210 Nicotine dependence, cigarettes, uncomplicated: Secondary | ICD-10-CM | POA: Diagnosis present

## 2017-10-26 DIAGNOSIS — M79644 Pain in right finger(s): Secondary | ICD-10-CM

## 2017-10-26 DIAGNOSIS — W228XXA Striking against or struck by other objects, initial encounter: Secondary | ICD-10-CM | POA: Diagnosis present

## 2017-10-26 HISTORY — PX: I&D EXTREMITY: SHX5045

## 2017-10-26 LAB — CBC WITH DIFFERENTIAL/PLATELET
ABS IMMATURE GRANULOCYTES: 0.05 10*3/uL (ref 0.00–0.07)
BASOS ABS: 0 10*3/uL (ref 0.0–0.1)
Basophils Relative: 0 %
EOS PCT: 1 %
Eosinophils Absolute: 0.1 10*3/uL (ref 0.0–0.5)
HEMATOCRIT: 39.6 % (ref 39.0–52.0)
HEMOGLOBIN: 12.6 g/dL — AB (ref 13.0–17.0)
IMMATURE GRANULOCYTES: 1 %
LYMPHS ABS: 0.6 10*3/uL — AB (ref 0.7–4.0)
LYMPHS PCT: 6 %
MCH: 25.7 pg — ABNORMAL LOW (ref 26.0–34.0)
MCHC: 31.8 g/dL (ref 30.0–36.0)
MCV: 80.8 fL (ref 80.0–100.0)
Monocytes Absolute: 0.2 10*3/uL (ref 0.1–1.0)
Monocytes Relative: 2 %
NEUTROS ABS: 9.3 10*3/uL — AB (ref 1.7–7.7)
NRBC: 0 % (ref 0.0–0.2)
Neutrophils Relative %: 90 %
Platelets: 285 10*3/uL (ref 150–400)
RBC: 4.9 MIL/uL (ref 4.22–5.81)
RDW: 12.6 % (ref 11.5–15.5)
WBC: 10.2 10*3/uL (ref 4.0–10.5)

## 2017-10-26 LAB — COMPREHENSIVE METABOLIC PANEL
ALT: 28 U/L (ref 0–44)
ANION GAP: 8 (ref 5–15)
AST: 29 U/L (ref 15–41)
Albumin: 3.7 g/dL (ref 3.5–5.0)
Alkaline Phosphatase: 65 U/L (ref 38–126)
BUN: 11 mg/dL (ref 6–20)
CHLORIDE: 106 mmol/L (ref 98–111)
CO2: 26 mmol/L (ref 22–32)
Calcium: 9 mg/dL (ref 8.9–10.3)
Creatinine, Ser: 0.84 mg/dL (ref 0.61–1.24)
GFR calc non Af Amer: >60 mL/min (ref >60)
Glucose, Bld: 69 mg/dL — ABNORMAL LOW (ref 70–99)
POTASSIUM: 3.8 mmol/L (ref 3.5–5.1)
SODIUM: 140 mmol/L (ref 135–145)
Total Bilirubin: 0.4 mg/dL (ref 0.3–1.2)
Total Protein: 7.4 g/dL (ref 6.5–8.1)

## 2017-10-26 SURGERY — IRRIGATION AND DEBRIDEMENT EXTREMITY
Anesthesia: General | Laterality: Right

## 2017-10-26 MED ORDER — LIDOCAINE HCL (CARDIAC) PF 100 MG/5ML IV SOSY
PREFILLED_SYRINGE | INTRAVENOUS | Status: DC | PRN
  Administered 2017-10-26: 100 mg via INTRAVENOUS

## 2017-10-26 MED ORDER — MIDAZOLAM HCL 5 MG/5ML IJ SOLN
INTRAMUSCULAR | Status: DC | PRN
  Administered 2017-10-26: 2 mg via INTRAVENOUS

## 2017-10-26 MED ORDER — LIDOCAINE HCL (PF) 1 % IJ SOLN
INTRAMUSCULAR | Status: DC | PRN
  Administered 2017-10-26: 4 mL

## 2017-10-26 MED ORDER — ONDANSETRON HCL 4 MG/2ML IJ SOLN
INTRAMUSCULAR | Status: AC
  Filled 2017-10-26: qty 2

## 2017-10-26 MED ORDER — PROMETHAZINE HCL 25 MG/ML IJ SOLN: 6.2500 mg | INTRAMUSCULAR | Status: DC | PRN

## 2017-10-26 MED ORDER — MIDAZOLAM HCL 2 MG/2ML IJ SOLN
INTRAMUSCULAR | Status: AC
  Filled 2017-10-26: qty 2

## 2017-10-26 MED ORDER — SUCCINYLCHOLINE CHLORIDE 200 MG/10ML IV SOSY
PREFILLED_SYRINGE | INTRAVENOUS | Status: AC
  Filled 2017-10-26: qty 10

## 2017-10-26 MED ORDER — PROPOFOL 10 MG/ML IV BOLUS
INTRAVENOUS | Status: DC | PRN
  Administered 2017-10-26: 200 mg via INTRAVENOUS

## 2017-10-26 MED ORDER — LIDOCAINE HCL (PF) 1 % IJ SOLN
INTRAMUSCULAR | Status: AC
  Filled 2017-10-26: qty 30

## 2017-10-26 MED ORDER — BUPIVACAINE-EPINEPHRINE (PF) 0.5% -1:200000 IJ SOLN
INTRAMUSCULAR | Status: DC | PRN
  Administered 2017-10-26: 4 mL via PERINEURAL

## 2017-10-26 MED ORDER — DEXAMETHASONE SODIUM PHOSPHATE 10 MG/ML IJ SOLN
INTRAMUSCULAR | Status: AC
  Filled 2017-10-26: qty 1

## 2017-10-26 MED ORDER — FENTANYL CITRATE (PF) 250 MCG/5ML IJ SOLN
INTRAMUSCULAR | Status: AC
  Filled 2017-10-26: qty 5

## 2017-10-26 MED ORDER — PROPOFOL 10 MG/ML IV BOLUS
INTRAVENOUS | Status: AC
  Filled 2017-10-26: qty 20

## 2017-10-26 MED ORDER — PIPERACILLIN-TAZOBACTAM 3.375 G IVPB 30 MIN
3.3750 g | INTRAVENOUS | Status: AC
  Administered 2017-10-26: 3.375 g via INTRAVENOUS
  Filled 2017-10-26: qty 50

## 2017-10-26 MED ORDER — SODIUM CHLORIDE 0.9 % IV SOLN
1.5000 g | Freq: Four times a day (QID) | INTRAVENOUS | Status: DC
  Administered 2017-10-26 – 2017-10-27 (×3): 1.5 g via INTRAVENOUS
  Filled 2017-10-26 (×5): qty 1.5

## 2017-10-26 MED ORDER — FENTANYL CITRATE (PF) 100 MCG/2ML IJ SOLN
INTRAMUSCULAR | Status: AC
  Filled 2017-10-26: qty 2

## 2017-10-26 MED ORDER — FENTANYL CITRATE (PF) 100 MCG/2ML IJ SOLN
25.0000 ug | INTRAMUSCULAR | Status: DC | PRN
  Administered 2017-10-26: 50 ug via INTRAVENOUS

## 2017-10-26 MED ORDER — POVIDONE-IODINE 10 % EX SWAB: 2.0000 "application " | Freq: Once | CUTANEOUS | Status: DC

## 2017-10-26 MED ORDER — FENTANYL CITRATE (PF) 250 MCG/5ML IJ SOLN
INTRAMUSCULAR | Status: DC | PRN
  Administered 2017-10-26 (×3): 50 ug via INTRAVENOUS

## 2017-10-26 MED ORDER — MORPHINE SULFATE (PF) 2 MG/ML IV SOLN
2.0000 mg | INTRAVENOUS | Status: DC | PRN
  Administered 2017-10-26 – 2017-10-27 (×3): 2 mg via INTRAVENOUS
  Filled 2017-10-26 (×3): qty 1

## 2017-10-26 MED ORDER — CHLORHEXIDINE GLUCONATE 4 % EX LIQD: 60.0000 mL | Freq: Once | CUTANEOUS | Status: DC

## 2017-10-26 MED ORDER — LACTATED RINGERS IV SOLN
INTRAVENOUS | Status: DC
  Administered 2017-10-26: 14:00:00 via INTRAVENOUS

## 2017-10-26 MED ORDER — DEXAMETHASONE SODIUM PHOSPHATE 4 MG/ML IJ SOLN
INTRAMUSCULAR | Status: DC | PRN
  Administered 2017-10-26: 10 mg via INTRAVENOUS

## 2017-10-26 MED ORDER — ONDANSETRON HCL 4 MG/2ML IJ SOLN
INTRAMUSCULAR | Status: DC | PRN
  Administered 2017-10-26: 4 mg via INTRAVENOUS

## 2017-10-26 MED ORDER — LIDOCAINE 2% (20 MG/ML) 5 ML SYRINGE
INTRAMUSCULAR | Status: AC
  Filled 2017-10-26: qty 5

## 2017-10-26 MED ORDER — BACITRACIN ZINC 500 UNIT/GM EX OINT
TOPICAL_OINTMENT | Freq: Every day | CUTANEOUS | Status: DC
  Administered 2017-10-27: 1 via TOPICAL
  Filled 2017-10-26: qty 28.4

## 2017-10-26 SURGICAL SUPPLY — 43 items
BANDAGE COBAN STERILE 2 (GAUZE/BANDAGES/DRESSINGS) IMPLANT
BLADE MINI RND TIP GREEN BEAV (BLADE) IMPLANT
BLADE SURG 15 STRL LF DISP TIS (BLADE) ×1 IMPLANT
BLADE SURG 15 STRL SS (BLADE) ×2
BNDG COHESIVE 1X5 TAN STRL LF (GAUZE/BANDAGES/DRESSINGS) ×3 IMPLANT
BNDG COHESIVE 4X5 TAN STRL (GAUZE/BANDAGES/DRESSINGS) ×3 IMPLANT
BNDG GAUZE ELAST 4 BULKY (GAUZE/BANDAGES/DRESSINGS) ×6 IMPLANT
CHLORAPREP W/TINT 26ML (MISCELLANEOUS) ×3 IMPLANT
CORDS BIPOLAR (ELECTRODE) IMPLANT
COVER BACK TABLE 60X90IN (DRAPES) ×3 IMPLANT
COVER MAYO STAND STRL (DRAPES) ×3 IMPLANT
COVER WAND RF STERILE (DRAPES) ×3 IMPLANT
CUFF TOURNIQUET SINGLE 18IN (TOURNIQUET CUFF) IMPLANT
DRAPE HALF SHEET 40X57 (DRAPES) ×3 IMPLANT
DRAPE SURG 17X23 STRL (DRAPES) ×3 IMPLANT
DRSG EMULSION OIL 3X3 NADH (GAUZE/BANDAGES/DRESSINGS) ×3 IMPLANT
GAUZE PACKING IODOFORM 1/2 (PACKING) ×3 IMPLANT
GAUZE SPONGE 4X4 12PLY STRL (GAUZE/BANDAGES/DRESSINGS) ×3 IMPLANT
GLOVE BIO SURGEON STRL SZ7.5 (GLOVE) ×3 IMPLANT
GLOVE BIOGEL PI IND STRL 7.0 (GLOVE) ×1 IMPLANT
GLOVE BIOGEL PI IND STRL 8 (GLOVE) ×1 IMPLANT
GLOVE BIOGEL PI INDICATOR 7.0 (GLOVE) ×2
GLOVE BIOGEL PI INDICATOR 8 (GLOVE) ×2
GLOVE ECLIPSE 6.5 STRL STRAW (GLOVE) ×3 IMPLANT
GOWN STRL REUS W/ TWL LRG LVL3 (GOWN DISPOSABLE) ×2 IMPLANT
GOWN STRL REUS W/TWL LRG LVL3 (GOWN DISPOSABLE) ×4
GOWN STRL REUS W/TWL XL LVL3 (GOWN DISPOSABLE) ×3 IMPLANT
NEEDLE HYPO 25X1 1.5 SAFETY (NEEDLE) IMPLANT
NS IRRIG 1000ML POUR BTL (IV SOLUTION) ×3 IMPLANT
PACK BASIN DAY SURGERY FS (CUSTOM PROCEDURE TRAY) ×3 IMPLANT
PADDING CAST ABS 4INX4YD NS (CAST SUPPLIES)
PADDING CAST ABS COTTON 4X4 ST (CAST SUPPLIES) IMPLANT
RUBBERBAND STERILE (MISCELLANEOUS) IMPLANT
SET IRRIG Y TYPE TUR BLADDER L (SET/KITS/TRAYS/PACK) IMPLANT
STOCKINETTE 6  STRL (DRAPES) ×2
STOCKINETTE 6 STRL (DRAPES) ×1 IMPLANT
SUT VICRYL RAPIDE 4-0 (SUTURE) IMPLANT
SUT VICRYL RAPIDE 4/0 PS 2 (SUTURE) IMPLANT
SYR 10ML LL (SYRINGE) IMPLANT
SYR BULB 3OZ (MISCELLANEOUS) ×3 IMPLANT
TOWEL OR 17X24 6PK STRL BLUE (TOWEL DISPOSABLE) ×3 IMPLANT
TOWEL OR NON WOVEN STRL DISP B (DISPOSABLE) ×3 IMPLANT
UNDERPAD 30X30 (UNDERPADS AND DIAPERS) ×3 IMPLANT
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
## 2017-10-26 NOTE — Progress Notes (Signed)
S/p R RF dorsal SQ abscess incision and drainage 10-26-17  Cultures obtained and sent to micro to assist primary team MDs in infection care/resolution. Will write orders for nursing staff to d/c packings tomorrow and begin wound care. Wounds expected to heal thru secondary intent. No surgical f/u necessary unless concerns develop.  Neil Crouch, MD Hand Surgery

## 2017-10-26 NOTE — Anesthesia Preprocedure Evaluation (Addendum)
Anesthesia Evaluation  Patient identified by MRN, date of birth, ID band Patient awake    Reviewed: Allergy & Precautions, NPO status , Patient's Chart, lab work & pertinent test results  Airway Mallampati: II  TM Distance: >3 FB Neck ROM: Full    Dental  (+) Teeth Intact, Dental Advisory Given   Pulmonary Current Smoker,    Pulmonary exam normal breath sounds clear to auscultation       Cardiovascular Exercise Tolerance: Good negative cardio ROS Normal cardiovascular exam Rhythm:Regular Rate:Normal     Neuro/Psych negative neurological ROS  negative psych ROS   GI/Hepatic negative GI ROS, Neg liver ROS,   Endo/Other  negative endocrine ROS  Renal/GU negative Renal ROS     Musculoskeletal right ring finger infection   Abdominal   Peds  Hematology negative hematology ROS (+)   Anesthesia Other Findings Day of surgery medications reviewed with the patient.  Reproductive/Obstetrics                            Anesthesia Physical Anesthesia Plan  ASA: II  Anesthesia Plan: General   Post-op Pain Management:    Induction: Intravenous  PONV Risk Score and Plan: 2 and Midazolam, Dexamethasone and Ondansetron  Airway Management Planned: LMA  Additional Equipment:   Intra-op Plan:   Post-operative Plan: Extubation in OR  Informed Consent: I have reviewed the patients History and Physical, chart, labs and discussed the procedure including the risks, benefits and alternatives for the proposed anesthesia with the patient or authorized representative who has indicated his/her understanding and acceptance.   Dental advisory given  Plan Discussed with: CRNA  Anesthesia Plan Comments: (Ate  Biscuit at 0730am.)       Anesthesia Quick Evaluation
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
## 2017-10-26 NOTE — Transfer of Care (Signed)
Immediate Anesthesia Transfer of Care Note  Patient: Justin Burgess  Procedure(s) Performed: IRRIGATION AND DEBRIDEMENT RIGHT RING FINGER (Right )  Patient Location: PACU  Anesthesia Type:General  Level of Consciousness: awake, alert  and oriented  Airway & Oxygen Therapy: Patient Spontanous Breathing and Patient connected to face mask oxygen  Post-op Assessment: Report given to RN and Post -op Vital signs reviewed and stable  Post vital signs: Reviewed and stable  Last Vitals:  Vitals Value Taken Time  BP 122/72 10/26/2017  3:48 PM  Temp    Pulse 64 10/26/2017  3:49 PM  Resp 14 10/26/2017  3:49 PM  SpO2 100 % 10/26/2017  3:49 PM  Vitals shown include unvalidated device data.  Last Pain:  Vitals:   10/26/17 1241  TempSrc: Oral  PainSc: 9       Patients Stated Pain Goal: 5 (10/26/17 1328)  Complications: No apparent anesthesia complications
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
## 2017-10-26 NOTE — Op Note (Signed)
10/26/2017  3:36 PM  PATIENT:  Justin Burgess  37 y.o. male  PRE-OPERATIVE DIAGNOSIS:  R RF abscess  POST-OPERATIVE DIAGNOSIS:  Same  PROCEDURE:  Incision and drainage, R RF abscess thru 2 incisions  SURGEON: Cliffton Asters. Janee Morn, MD  PHYSICIAN ASSISTANT: Danielle Rankin, OPA-C  ANESTHESIA:  general  SPECIMENS:  Swabs to micro  DRAINS:   None  EBL:  less than 50 mL  PREOPERATIVE INDICATIONS:  [name] is a  37 y.o. male with a suspected R RF subcutaneous abscess and ascending lymphangitis  The risks benefits and alternatives were discussed with the patient preoperatively including but not limited to the risks of infection, bleeding, nerve injury, cardiopulmonary complications, the need for revision surgery, among others, and the patient verbalized understanding and consented to proceed.  OPERATIVE IMPLANTS: none  OPERATIVE PROCEDURE:  After receiving prophylactic antibiotics, the patient was escorted to the operative theatre and placed in a supine position.  General anesthesia was administered.  A surgical "time-out" was performed during which the planned procedure, proposed operative site, and the correct patient identity were compared to the operative consent and agreement confirmed by the circulating nurse according to current facility policy.  Following application of a tourniquet to the operative extremity, the exposed skin was prepped with Betadine and draped in the usual sterile fashion.  The limb was exsanguinated with an Esmarch bandage and the tourniquet inflated to approximately higher than systolic BP.  A linear incision was made on the dorsal radial aspect of the ring finger, centered on the scab.  Spreading dissection was carried down to the subcutaneous plane.  Gross purulence was encountered and cultures were obtained.  The extensor tendon appeared fairly unremarkable.  Spreading dissection was carried in the dorsal subcutaneous plane more distally,  and additional creamy purulence was encountered all the way as far distal as the level of the DIP joint.  Therefore a second counterincision distally was made and allowed for through and through irrigation.  Once the wound was copiously irrigated such that the irrigant was now clear, tourniquet was released and packings were placed in both wounds to prevent premature closure.  Dressing was applied.  Local anesthetic was placed at the base of the digit to help with postoperative pain control and he was taken to recovery room stable condition, breathing spontaneously.  DISPOSITION: He will be transferred to the floor under the care of the hospitalist service for resolution of his infection.  I will write for wound care orders, and the wound to be expected to close secondarily.
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
## 2017-10-26 NOTE — Discharge Instructions (Addendum)
Go to the ER I spoke with Dr Janee Morn, hand consultant DO NOT EAT OR DRINK - in case surgery is indicated

## 2017-10-26 NOTE — Anesthesia Procedure Notes (Signed)
Procedure Name: LMA Insertion Date/Time: 10/26/2017 3:12 PM Performed by: Demetrio Lapping, CRNA Pre-anesthesia Checklist: Patient identified, Emergency Drugs available, Suction available and Patient being monitored Patient Re-evaluated:Patient Re-evaluated prior to induction Oxygen Delivery Method: Circle System Utilized Preoxygenation: Pre-oxygenation with 100% oxygen Induction Type: IV induction Ventilation: Mask ventilation without difficulty LMA: LMA inserted LMA Size: 5.0 Number of attempts: 1 Airway Equipment and Method: Bite block Placement Confirmation: positive ETCO2 Tube secured with: Tape Dental Injury: Teeth and Oropharynx as per pre-operative assessment
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
## 2017-10-26 NOTE — Discharge Instructions (Addendum)
1)Keep Wound Clean----Wash wounds with soap and running water daily, Re-dress with dab of bacitracin in wound and light dry gauze dressing. Repeat daily until wounds are closed 2)Smoking cessation advised--- quit smoking 3)follow- up with Dr Janee Morn (hand surgeon) if any concerns  Work aggressively to regain all your finger motion.

## 2017-10-27 ENCOUNTER — Encounter (HOSPITAL_COMMUNITY): Payer: Self-pay | Admitting: *Deleted

## 2017-10-27 DIAGNOSIS — L0291 Cutaneous abscess, unspecified: Secondary | ICD-10-CM

## 2017-10-27 LAB — BASIC METABOLIC PANEL
ANION GAP: 8 (ref 5–15)
BUN: 12 mg/dL (ref 6–20)
CALCIUM: 9.1 mg/dL (ref 8.9–10.3)
CO2: 24 mmol/L (ref 22–32)
Chloride: 105 mmol/L (ref 98–111)
Creatinine, Ser: 0.92 mg/dL (ref 0.61–1.24)
GFR calc Af Amer: >60 mL/min (ref >60)
Glucose, Bld: 163 mg/dL — ABNORMAL HIGH (ref 70–99)
POTASSIUM: 4.6 mmol/L (ref 3.5–5.1)
SODIUM: 137 mmol/L (ref 135–145)

## 2017-10-27 LAB — CBC
HCT: 39.7 % (ref 39.0–52.0)
HEMOGLOBIN: 12.7 g/dL — AB (ref 13.0–17.0)
MCH: 25.9 pg — ABNORMAL LOW (ref 26.0–34.0)
MCHC: 32 g/dL (ref 30.0–36.0)
MCV: 80.9 fL (ref 80.0–100.0)
PLATELETS: 317 10*3/uL (ref 150–400)
RBC: 4.91 MIL/uL (ref 4.22–5.81)
RDW: 12.4 % (ref 11.5–15.5)
WBC: 9.6 10*3/uL (ref 4.0–10.5)
nRBC: 0 % (ref 0.0–0.2)

## 2017-10-27 LAB — HIV ANTIBODY (ROUTINE TESTING W REFLEX): HIV SCREEN 4TH GENERATION: NONREACTIVE

## 2017-10-27 MED ORDER — ACETAMINOPHEN 325 MG PO TABS: 650.0000 mg | ORAL_TABLET | ORAL | 0 refills | Status: AC | PRN

## 2017-10-27 MED ORDER — AMOXICILLIN 500 MG PO CAPS: 500.0000 mg | ORAL_CAPSULE | Freq: Three times a day (TID) | ORAL | 0 refills | Status: AC

## 2017-10-27 MED ORDER — IBUPROFEN 200 MG PO TABS: 600.0000 mg | ORAL_TABLET | Freq: Four times a day (QID) | ORAL | 0 refills | Status: AC | PRN

## 2017-10-27 NOTE — Progress Notes (Signed)
Reviewed AVS discharge instructions with patient/caregiver. Patient/caregiver verbalizes understanding of instructions received. AVS and prescriptions received by patient/caregiver. If present, telemetry box removed and central cardiac monitoring department notified of discharge. Peripheral IV removed, site benign with tip intact. Right finger wound redressed per discharge instructions and supplies given to patient to take home.

## 2017-10-27 NOTE — Progress Notes (Signed)
Patient transported to discharge lounge by Rayna Sexton to await ride.
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
## 2017-10-30 LAB — AEROBIC/ANAEROBIC CULTURE (SURGICAL/DEEP WOUND)

## 2017-10-30 LAB — AEROBIC/ANAEROBIC CULTURE W GRAM STAIN (SURGICAL/DEEP WOUND)

## 2017-11-03 NOTE — Anesthesia Postprocedure Evaluation (Signed)
Anesthesia Post Note  Patient: Justin Burgess  Procedure(s) Performed: IRRIGATION AND DEBRIDEMENT RIGHT RING FINGER (Right )     Patient location during evaluation: PACU Anesthesia Type: General Level of consciousness: awake and alert Pain management: pain level controlled Vital Signs Assessment: post-procedure vital signs reviewed and stable Respiratory status: spontaneous breathing, nonlabored ventilation, respiratory function stable and patient connected to nasal cannula oxygen Cardiovascular status: blood pressure returned to baseline and stable Postop Assessment: no apparent nausea or vomiting Anesthetic complications: no    Last Vitals:  Vitals:   10/27/17 0027 10/27/17 0451  BP: 135/81 (!) 110/57  Pulse: (!) 101 97  Resp: 17 16  Temp: (!) 36.4 C 36.5 C  SpO2: 98% 98%    Last Pain:  Vitals:   10/27/17 0640  TempSrc:   PainSc: 7                  Cecile Hearing

## 2020-06-07 ENCOUNTER — Emergency Department (HOSPITAL_COMMUNITY)
Admission: EM | Admit: 2020-06-07 | Discharge: 2020-06-07 | Disposition: A | Discharge status: Home / Self Care | Payer: Self-pay | Attending: Emergency Medicine | Admitting: Emergency Medicine

## 2020-06-07 ENCOUNTER — Other Ambulatory Visit: Payer: Self-pay

## 2020-06-07 ENCOUNTER — Encounter (HOSPITAL_COMMUNITY): Payer: Self-pay | Admitting: Emergency Medicine

## 2020-06-07 DIAGNOSIS — R400 Somnolence: Secondary | ICD-10-CM | POA: Insufficient documentation

## 2020-06-07 DIAGNOSIS — R1032 Left lower quadrant pain: Secondary | ICD-10-CM | POA: Insufficient documentation

## 2020-06-07 DIAGNOSIS — Z5321 Procedure and treatment not carried out due to patient leaving prior to being seen by health care provider: Secondary | ICD-10-CM | POA: Insufficient documentation

## 2020-06-07 NOTE — ED Triage Notes (Signed)
Patient BIBA from home c/o LLQ abdominal pain. Denies n/v/d. EMS reports no pain w/ palpation. Patient extremely drowsy and pupils pinpoint. Hx of substance abuse. EMS reports patient denies any narcotic use. VS WDL.

## 2020-07-23 ENCOUNTER — Emergency Department (HOSPITAL_COMMUNITY)
Admission: EM | Admit: 2020-07-23 | Discharge: 2020-07-23 | Payer: Self-pay | Attending: Emergency Medicine | Admitting: Emergency Medicine

## 2020-07-23 ENCOUNTER — Other Ambulatory Visit: Payer: Self-pay

## 2020-07-23 ENCOUNTER — Emergency Department (HOSPITAL_COMMUNITY): Payer: Self-pay

## 2020-07-23 ENCOUNTER — Encounter (HOSPITAL_COMMUNITY): Payer: Self-pay | Admitting: *Deleted

## 2020-07-23 DIAGNOSIS — H21563 Pupillary abnormality, bilateral: Secondary | ICD-10-CM | POA: Insufficient documentation

## 2020-07-23 DIAGNOSIS — R7309 Other abnormal glucose: Secondary | ICD-10-CM | POA: Insufficient documentation

## 2020-07-23 DIAGNOSIS — F17210 Nicotine dependence, cigarettes, uncomplicated: Secondary | ICD-10-CM | POA: Insufficient documentation

## 2020-07-23 DIAGNOSIS — R4182 Altered mental status, unspecified: Secondary | ICD-10-CM | POA: Insufficient documentation

## 2020-07-23 DIAGNOSIS — R410 Disorientation, unspecified: Secondary | ICD-10-CM

## 2020-07-23 LAB — CBC WITH DIFFERENTIAL/PLATELET
Abs Immature Granulocytes: 0.02 K/uL (ref 0.00–0.07)
Basophils Absolute: 0 K/uL (ref 0.0–0.1)
Basophils Relative: 0 %
Eosinophils Absolute: 0.1 K/uL (ref 0.0–0.5)
Eosinophils Relative: 2 %
HCT: 43.2 % (ref 39.0–52.0)
Hemoglobin: 13.8 g/dL (ref 13.0–17.0)
Immature Granulocytes: 0 %
Lymphocytes Relative: 15 %
Lymphs Abs: 1.2 K/uL (ref 0.7–4.0)
MCH: 26.8 pg (ref 26.0–34.0)
MCHC: 31.9 g/dL (ref 30.0–36.0)
MCV: 83.9 fL (ref 80.0–100.0)
Monocytes Absolute: 0.7 K/uL (ref 0.1–1.0)
Monocytes Relative: 9 %
Neutro Abs: 5.5 K/uL (ref 1.7–7.7)
Neutrophils Relative %: 74 %
Platelets: 261 K/uL (ref 150–400)
RBC: 5.15 MIL/uL (ref 4.22–5.81)
RDW: 12.7 % (ref 11.5–15.5)
WBC: 7.6 K/uL (ref 4.0–10.5)
nRBC: 0 % (ref 0.0–0.2)

## 2020-07-23 LAB — TROPONIN I (HIGH SENSITIVITY): Troponin I (High Sensitivity): <2 ng/L

## 2020-07-23 LAB — ACETAMINOPHEN LEVEL: Acetaminophen (Tylenol), Serum: <10 ug/mL — ABNORMAL LOW (ref 10–30)

## 2020-07-23 LAB — COMPREHENSIVE METABOLIC PANEL WITH GFR
ALT: 28 U/L (ref 0–44)
AST: 25 U/L (ref 15–41)
Albumin: 4.3 g/dL (ref 3.5–5.0)
Alkaline Phosphatase: 62 U/L (ref 38–126)
Anion gap: 8 (ref 5–15)
BUN: 11 mg/dL (ref 6–20)
CO2: 27 mmol/L (ref 22–32)
Calcium: 9.3 mg/dL (ref 8.9–10.3)
Chloride: 106 mmol/L (ref 98–111)
Creatinine, Ser: 0.98 mg/dL (ref 0.61–1.24)
GFR, Estimated: >60 mL/min
Glucose, Bld: 109 mg/dL — ABNORMAL HIGH (ref 70–99)
Potassium: 3.9 mmol/L (ref 3.5–5.1)
Sodium: 141 mmol/L (ref 135–145)
Total Bilirubin: 0.7 mg/dL (ref 0.3–1.2)
Total Protein: 7.6 g/dL (ref 6.5–8.1)

## 2020-07-23 LAB — SALICYLATE LEVEL: Salicylate Lvl: <7 mg/dL — ABNORMAL LOW (ref 7.0–30.0)

## 2020-07-23 LAB — CBG MONITORING, ED: Glucose-Capillary: 116 mg/dL — ABNORMAL HIGH (ref 70–99)

## 2020-07-23 LAB — CK: Total CK: 141 U/L (ref 49–397)

## 2020-07-23 LAB — ETHANOL: Alcohol, Ethyl (B): <10 mg/dL (ref <10)

## 2020-07-23 MED ORDER — SODIUM CHLORIDE 0.9 % IV BOLUS
1000.0000 mL | Freq: Once | INTRAVENOUS | Status: AC
  Administered 2020-07-23: 1000 mL via INTRAVENOUS

## 2020-07-23 NOTE — ED Triage Notes (Signed)
Per police/EMS, pt was arrested today. He complained of being hot while riding in the back of the police car. Police turned up [facility]. He then stopped responding. Police pulled over and had pt evaluated by EMS.   CBG 133 HR 74 BP 118/60
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
## 2020-07-23 NOTE — Discharge Instructions (Addendum)
Your medical work-up is normal.  There is no particular reason for your confusion  You may go to jail at this point  Return to ER if you have worse confusion, thoughts of harming yourself or others

## 2021-06-15 ENCOUNTER — Emergency Department (HOSPITAL_COMMUNITY): Payer: Self-pay

## 2021-06-15 ENCOUNTER — Emergency Department (HOSPITAL_COMMUNITY)
Admission: EM | Admit: 2021-06-15 | Discharge: 2021-06-16 | Disposition: A | Discharge status: Home / Self Care | Payer: Self-pay | Attending: Emergency Medicine | Admitting: Emergency Medicine

## 2021-06-15 ENCOUNTER — Other Ambulatory Visit: Payer: Self-pay

## 2021-06-15 ENCOUNTER — Encounter (HOSPITAL_COMMUNITY): Payer: Self-pay | Admitting: *Deleted

## 2021-06-15 DIAGNOSIS — S50311A Abrasion of right elbow, initial encounter: Secondary | ICD-10-CM | POA: Insufficient documentation

## 2021-06-15 DIAGNOSIS — R519 Headache, unspecified: Secondary | ICD-10-CM | POA: Insufficient documentation

## 2021-06-15 DIAGNOSIS — S42024A Nondisplaced fracture of shaft of right clavicle, initial encounter for closed fracture: Secondary | ICD-10-CM | POA: Insufficient documentation

## 2021-06-15 DIAGNOSIS — R7309 Other abnormal glucose: Secondary | ICD-10-CM | POA: Insufficient documentation

## 2021-06-15 LAB — CBG MONITORING, ED: Glucose-Capillary: 126 mg/dL — ABNORMAL HIGH (ref 70–99)

## 2021-06-15 MED ORDER — OXYCODONE-ACETAMINOPHEN 5-325 MG PO TABS
1.0000 | ORAL_TABLET | Freq: Once | ORAL | Status: AC
  Administered 2021-06-15: 1 via ORAL
  Filled 2021-06-15: qty 1

## 2021-06-15 MED ORDER — FENTANYL CITRATE PF 50 MCG/ML IJ SOSY
50.0000 ug | PREFILLED_SYRINGE | Freq: Once | INTRAMUSCULAR | Status: AC
  Administered 2021-06-15: 50 ug via INTRAVENOUS
  Filled 2021-06-15: qty 1

## 2021-06-15 MED ORDER — IBUPROFEN 800 MG PO TABS
800.0000 mg | ORAL_TABLET | Freq: Once | ORAL | Status: AC
  Administered 2021-06-15: 800 mg via ORAL
  Filled 2021-06-15: qty 1

## 2021-06-15 MED ORDER — OXYCODONE HCL 5 MG PO TABS: 5.0000 mg | ORAL_TABLET | Freq: Four times a day (QID) | ORAL | 0 refills | Status: AC | PRN

## 2021-06-15 MED ORDER — IBUPROFEN 800 MG PO TABS: 800.0000 mg | ORAL_TABLET | Freq: Three times a day (TID) | ORAL | 0 refills | Status: AC | PRN

## 2021-06-15 MED ORDER — ACETAMINOPHEN 325 MG PO TABS: 650.0000 mg | ORAL_TABLET | Freq: Four times a day (QID) | ORAL | 0 refills | Status: AC | PRN

## 2021-06-15 NOTE — ED Notes (Signed)
Patient transported to CT 

## 2021-06-15 NOTE — ED Notes (Signed)
ED Provider at bedside. 
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
## 2024-01-04 ENCOUNTER — Encounter (HOSPITAL_COMMUNITY): Payer: Self-pay

## 2024-01-04 ENCOUNTER — Emergency Department (HOSPITAL_COMMUNITY)
Admission: EM | Admit: 2024-01-04 | Discharge: 2024-01-04 | Disposition: A | Discharge status: Home / Self Care | Attending: Emergency Medicine | Admitting: Emergency Medicine

## 2024-01-04 ENCOUNTER — Emergency Department (HOSPITAL_COMMUNITY)

## 2024-01-04 ENCOUNTER — Other Ambulatory Visit: Payer: Self-pay

## 2024-01-04 DIAGNOSIS — M542 Cervicalgia: Secondary | ICD-10-CM | POA: Insufficient documentation

## 2024-01-04 DIAGNOSIS — S0011XA Contusion of right eyelid and periocular area, initial encounter: Secondary | ICD-10-CM | POA: Diagnosis not present

## 2024-01-04 DIAGNOSIS — S62307A Unspecified fracture of fifth metacarpal bone, left hand, initial encounter for closed fracture: Secondary | ICD-10-CM

## 2024-01-04 DIAGNOSIS — S62347A Nondisplaced fracture of base of fifth metacarpal bone. left hand, initial encounter for closed fracture: Secondary | ICD-10-CM | POA: Insufficient documentation

## 2024-01-04 DIAGNOSIS — S0083XA Contusion of other part of head, initial encounter: Secondary | ICD-10-CM

## 2024-01-04 DIAGNOSIS — Y92009 Unspecified place in unspecified non-institutional (private) residence as the place of occurrence of the external cause: Secondary | ICD-10-CM | POA: Insufficient documentation

## 2024-01-04 DIAGNOSIS — M2500 Hemarthrosis, unspecified joint: Secondary | ICD-10-CM

## 2024-01-04 DIAGNOSIS — S6992XA Unspecified injury of left wrist, hand and finger(s), initial encounter: Secondary | ICD-10-CM | POA: Diagnosis present

## 2024-01-04 DIAGNOSIS — R600 Localized edema: Secondary | ICD-10-CM | POA: Insufficient documentation

## 2024-01-04 LAB — I-STAT CHEM 8, ED
BUN: 18 mg/dL (ref 6–20)
Calcium, Ion: 1.07 mmol/L — ABNORMAL LOW (ref 1.15–1.40)
Chloride: 103 mmol/L (ref 98–111)
Creatinine, Ser: 1 mg/dL (ref 0.61–1.24)
Glucose, Bld: 95 mg/dL (ref 70–99)
HCT: 43 % (ref 39.0–52.0)
Hemoglobin: 14.6 g/dL (ref 13.0–17.0)
Potassium: 4.2 mmol/L (ref 3.5–5.1)
Sodium: 139 mmol/L (ref 135–145)
TCO2: 24 mmol/L (ref 22–32)

## 2024-01-04 LAB — COMPREHENSIVE METABOLIC PANEL WITH GFR
ALT: 29 U/L (ref 0–44)
AST: 28 U/L (ref 15–41)
Albumin: 4.1 g/dL (ref 3.5–5.0)
Alkaline Phosphatase: 86 U/L (ref 38–126)
Anion gap: 9 (ref 5–15)
BUN: 16 mg/dL (ref 6–20)
CO2: 27 mmol/L (ref 22–32)
Calcium: 9.2 mg/dL (ref 8.9–10.3)
Chloride: 103 mmol/L (ref 98–111)
Creatinine, Ser: 1.01 mg/dL (ref 0.61–1.24)
GFR, Estimated: >60 mL/min
Glucose, Bld: 96 mg/dL (ref 70–99)
Potassium: 4.3 mmol/L (ref 3.5–5.1)
Sodium: 139 mmol/L (ref 135–145)
Total Bilirubin: 0.5 mg/dL (ref 0.0–1.2)
Total Protein: 7.3 g/dL (ref 6.5–8.1)

## 2024-01-04 LAB — CBC
HCT: 43.3 % (ref 39.0–52.0)
Hemoglobin: 14.2 g/dL (ref 13.0–17.0)
MCH: 26.8 pg (ref 26.0–34.0)
MCHC: 32.8 g/dL (ref 30.0–36.0)
MCV: 81.7 fL (ref 80.0–100.0)
Platelets: 292 K/uL (ref 150–400)
RBC: 5.3 MIL/uL (ref 4.22–5.81)
RDW: 12.7 % (ref 11.5–15.5)
WBC: 15.4 K/uL — ABNORMAL HIGH (ref 4.0–10.5)
nRBC: 0 % (ref 0.0–0.2)

## 2024-01-04 LAB — SAMPLE TO BLOOD BANK

## 2024-01-04 LAB — PROTIME-INR
INR: 1 (ref 0.8–1.2)
Prothrombin Time: 14 s (ref 11.4–15.2)

## 2024-01-04 LAB — I-STAT CG4 LACTIC ACID, ED: Lactic Acid, Venous: 2.8 mmol/L (ref 0.5–1.9)

## 2024-01-04 LAB — ETHANOL: Alcohol, Ethyl (B): <15 mg/dL

## 2024-01-04 MED ORDER — OXYCODONE-ACETAMINOPHEN 5-325 MG PO TABS: 1.0000 | ORAL_TABLET | Freq: Four times a day (QID) | ORAL | 0 refills | Status: AC | PRN

## 2024-01-04 MED ORDER — LACTATED RINGERS IV BOLUS
1000.0000 mL | Freq: Once | INTRAVENOUS | Status: AC
  Administered 2024-01-04: 1000 mL via INTRAVENOUS

## 2024-01-04 MED ORDER — ONDANSETRON HCL 4 MG/2ML IJ SOLN
4.0000 mg | Freq: Once | INTRAMUSCULAR | Status: AC
  Administered 2024-01-04: 4 mg via INTRAVENOUS
  Filled 2024-01-04: qty 2

## 2024-01-04 MED ORDER — MORPHINE SULFATE (PF) 4 MG/ML IV SOLN
4.0000 mg | Freq: Once | INTRAVENOUS | Status: AC
  Administered 2024-01-04: 4 mg via INTRAVENOUS
  Filled 2024-01-04: qty 1

## 2024-01-04 MED ORDER — HYDROMORPHONE HCL 1 MG/ML IJ SOLN
1.0000 mg | Freq: Once | INTRAMUSCULAR | Status: AC
  Administered 2024-01-04: 1 mg via INTRAVENOUS
  Filled 2024-01-04: qty 1
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
## 2024-01-04 NOTE — ED Notes (Signed)
 C-collar removed by PA Rigney.
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
## 2024-01-04 NOTE — Progress Notes (Signed)
 Orthopedic Tech Progress Note Patient Details:  Justin Burgess 1980-03-21 [id number]  Ortho Devices Type of Ortho Device: Knee Immobilizer, Cotton web roll, Ace wrap, Crutches, Ulna gutter splint Ortho Device/Splint Location: L 5th Metacarpal, R patella Ortho Device/Splint Interventions: Ordered, Application, Adjustment   Post Interventions Patient Tolerated: Well Instructions Provided: Care of device  [name] L Atia Haupt 01/04/2024, 10:35 PM

## 2024-01-04 NOTE — ED Notes (Signed)
 Patient given discharge and follow-up instructions. Patient verbalized understanding. Patient left with family via private vehicle.
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
